# Patient Record
Sex: Female | Born: 1990 | Race: Black or African American
Health system: Southern US, Community
[De-identification: ages and names within clinical notes are randomized; demographics above are authoritative.]

## PROBLEM LIST (undated history)

## (undated) DIAGNOSIS — J45909 Unspecified asthma, uncomplicated: Secondary | ICD-10-CM

## (undated) DIAGNOSIS — F419 Anxiety disorder, unspecified: Secondary | ICD-10-CM

## (undated) DIAGNOSIS — Z3490 Encounter for supervision of normal pregnancy, unspecified, unspecified trimester: Secondary | ICD-10-CM

## (undated) HISTORY — PX: NO PAST SURGERIES: SHX2092

## (undated) HISTORY — PX: FOOT FRACTURE SURGERY: SHX645

## (undated) HISTORY — DX: Encounter for supervision of normal pregnancy, unspecified, unspecified trimester: Z34.90

---
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
## 2012-02-15 NOTE — L&D Delivery Note (Signed)
Pt was admitted last pm for cytotec induction secondary to postterm pregnancy. She had pitocin augmentation and AROM. She completed the first stage with out difficulty. The second stage was uncomplicated. She had a SVD of one live black female infant over 2 degree midline tear. Placenta- S/I. Nuchal cord x 1. Tear closed with 3-0 chromic. Baby to newborn nursery. EBL-400cc

## 2012-04-25 LAB — OB RESULTS CONSOLE GBS: GBS: NEGATIVE

## 2012-05-30 ENCOUNTER — Inpatient Hospital Stay (HOSPITAL_COMMUNITY): Admission: AD | Admit: 2012-05-30 | Payer: Self-pay | Source: Ambulatory Visit | Admitting: Obstetrics and Gynecology

## 2012-06-01 ENCOUNTER — Encounter (HOSPITAL_COMMUNITY): Payer: Self-pay | Admitting: *Deleted

## 2012-06-01 ENCOUNTER — Telehealth (HOSPITAL_COMMUNITY): Payer: Self-pay | Admitting: *Deleted

## 2012-06-01 NOTE — Telephone Encounter (Signed)
Preadmission screen  

## 2012-06-05 ENCOUNTER — Encounter (HOSPITAL_COMMUNITY): Payer: Self-pay

## 2012-06-05 ENCOUNTER — Inpatient Hospital Stay (HOSPITAL_COMMUNITY)
Admission: RE | Admit: 2012-06-05 | Discharge: 2012-06-08 | DRG: 775 | Disposition: A | Discharge status: Home / Self Care | Payer: Medicaid Other | Source: Ambulatory Visit | Attending: Obstetrics & Gynecology | Admitting: Obstetrics & Gynecology

## 2012-06-05 VITALS — BP 112/75 | HR 89 | Temp 97.9°F | Resp 20 | Ht 65.0 in | Wt 183.0 lb

## 2012-06-05 DIAGNOSIS — O480 Post-term pregnancy: Principal | ICD-10-CM | POA: Diagnosis present

## 2012-06-05 DIAGNOSIS — Z3480 Encounter for supervision of other normal pregnancy, unspecified trimester: Secondary | ICD-10-CM

## 2012-06-05 LAB — CBC
Hemoglobin: 10.7 g/dL — ABNORMAL LOW (ref 12.0–15.0)
MCH: 26.2 pg (ref 26.0–34.0)
MCHC: 32.7 g/dL (ref 30.0–36.0)
Platelets: 146 10*3/uL — ABNORMAL LOW (ref 150–400)
RBC: 4.08 MIL/uL (ref 3.87–5.11)

## 2012-06-05 MED ORDER — ONDANSETRON HCL 4 MG/2ML IJ SOLN: 4.0000 mg | Freq: Four times a day (QID) | INTRAMUSCULAR | Status: DC | PRN

## 2012-06-05 MED ORDER — FLEET ENEMA 7-19 GM/118ML RE ENEM: 1.0000 | ENEMA | RECTAL | Status: DC | PRN

## 2012-06-05 MED ORDER — OXYTOCIN 40 UNITS IN LACTATED RINGERS INFUSION - SIMPLE MED
1.0000 m[IU]/min | INTRAVENOUS | Status: DC
  Administered 2012-06-06: 2 m[IU]/min via INTRAVENOUS

## 2012-06-05 MED ORDER — LACTATED RINGERS IV SOLN
INTRAVENOUS | Status: DC
  Administered 2012-06-06 (×5): via INTRAVENOUS

## 2012-06-05 MED ORDER — CITRIC ACID-SODIUM CITRATE 334-500 MG/5ML PO SOLN: 30.0000 mL | ORAL | Status: DC | PRN

## 2012-06-05 MED ORDER — LACTATED RINGERS IV SOLN: 500.0000 mL | INTRAVENOUS | Status: DC | PRN

## 2012-06-05 MED ORDER — OXYTOCIN BOLUS FROM INFUSION: 500.0000 mL | INTRAVENOUS | Status: DC

## 2012-06-05 MED ORDER — TERBUTALINE SULFATE 1 MG/ML IJ SOLN: 0.2500 mg | Freq: Once | INTRAMUSCULAR | Status: AC | PRN

## 2012-06-05 MED ORDER — OXYCODONE-ACETAMINOPHEN 5-325 MG PO TABS: 1.0000 | ORAL_TABLET | ORAL | Status: DC | PRN

## 2012-06-05 MED ORDER — OXYTOCIN 40 UNITS IN LACTATED RINGERS INFUSION - SIMPLE MED
62.5000 mL/h | INTRAVENOUS | Status: DC
  Administered 2012-06-06: 62.5 mL/h via INTRAVENOUS
  Filled 2012-06-05: qty 1000

## 2012-06-05 MED ORDER — MISOPROSTOL 25 MCG QUARTER TABLET
25.0000 ug | ORAL_TABLET | ORAL | Status: DC | PRN
  Administered 2012-06-05 – 2012-06-06 (×2): 25 ug via VAGINAL
  Filled 2012-06-05: qty 0.25
  Filled 2012-06-05: qty 1
  Filled 2012-06-05 (×2): qty 0.25

## 2012-06-05 MED ORDER — BUTORPHANOL TARTRATE 1 MG/ML IJ SOLN
1.0000 mg | INTRAMUSCULAR | Status: DC | PRN
  Administered 2012-06-06 (×2): 1 mg via INTRAVENOUS
  Filled 2012-06-05 (×2): qty 1

## 2012-06-05 MED ORDER — IBUPROFEN 600 MG PO TABS: 600.0000 mg | ORAL_TABLET | Freq: Four times a day (QID) | ORAL | Status: DC | PRN

## 2012-06-05 MED ORDER — LIDOCAINE HCL (PF) 1 % IJ SOLN
30.0000 mL | INTRAMUSCULAR | Status: DC | PRN
  Administered 2012-06-06: 30 mL via SUBCUTANEOUS
  Filled 2012-06-05 (×2): qty 30

## 2012-06-05 MED ORDER — ACETAMINOPHEN 325 MG PO TABS: 650.0000 mg | ORAL_TABLET | ORAL | Status: DC | PRN

## 2012-06-05 NOTE — H&P (Signed)
22 y.o. G1P0  Estimated Date of Delivery: 05/30/12 admitted at [redacted] weeks gestation for induction.  Prenatal Transfer Tool  Maternal Diabetes: No Genetic Screening: Normal Maternal Ultrasounds/Referrals: Normal Fetal Ultrasounds or other Referrals:  None Maternal Substance Abuse:  No Significant Maternal Medications:  None Significant Maternal Lab Results: None Other Significant Pregnancy Complications:  Post dates  Afebrile, VSS Heart and Lungs: No active disease Abdomen: soft, gravid, EFW AGA. Cervical exam:  1/0, -3 (per nurse exam)  Impression: Post date pregnancy  Plan:  Cytotec/pitocin induction

## 2012-06-06 ENCOUNTER — Inpatient Hospital Stay (HOSPITAL_COMMUNITY): Payer: Medicaid Other | Admitting: Anesthesiology

## 2012-06-06 ENCOUNTER — Encounter (HOSPITAL_COMMUNITY): Payer: Self-pay | Admitting: Anesthesiology

## 2012-06-06 ENCOUNTER — Encounter (HOSPITAL_COMMUNITY): Payer: Self-pay

## 2012-06-06 LAB — RPR: RPR Ser Ql: NONREACTIVE

## 2012-06-06 MED ORDER — OXYCODONE-ACETAMINOPHEN 5-325 MG PO TABS
1.0000 | ORAL_TABLET | ORAL | Status: DC | PRN
  Administered 2012-06-06 – 2012-06-07 (×3): 1 via ORAL
  Administered 2012-06-07 – 2012-06-08 (×2): 2 via ORAL
  Filled 2012-06-06 (×3): qty 1
  Filled 2012-06-06: qty 2
  Filled 2012-06-06 (×2): qty 1

## 2012-06-06 MED ORDER — TETANUS-DIPHTH-ACELL PERTUSSIS 5-2.5-18.5 LF-MCG/0.5 IM SUSP
0.5000 mL | Freq: Once | INTRAMUSCULAR | Status: AC
  Administered 2012-06-07: 0.5 mL via INTRAMUSCULAR

## 2012-06-06 MED ORDER — EPHEDRINE 5 MG/ML INJ
10.0000 mg | INTRAVENOUS | Status: DC | PRN
  Filled 2012-06-06: qty 4
  Filled 2012-06-06: qty 2

## 2012-06-06 MED ORDER — EPHEDRINE 5 MG/ML INJ
10.0000 mg | INTRAVENOUS | Status: DC | PRN
  Filled 2012-06-06: qty 2

## 2012-06-06 MED ORDER — DIPHENHYDRAMINE HCL 50 MG/ML IJ SOLN: 12.5000 mg | INTRAMUSCULAR | Status: DC | PRN

## 2012-06-06 MED ORDER — PHENYLEPHRINE 40 MCG/ML (10ML) SYRINGE FOR IV PUSH (FOR BLOOD PRESSURE SUPPORT)
80.0000 ug | PREFILLED_SYRINGE | INTRAVENOUS | Status: DC | PRN
  Filled 2012-06-06: qty 2
  Filled 2012-06-06: qty 5

## 2012-06-06 MED ORDER — SIMETHICONE 80 MG PO CHEW: 80.0000 mg | CHEWABLE_TABLET | ORAL | Status: DC | PRN

## 2012-06-06 MED ORDER — MEASLES, MUMPS & RUBELLA VAC ~~LOC~~ INJ
0.5000 mL | INJECTION | Freq: Once | SUBCUTANEOUS | Status: DC
  Filled 2012-06-06: qty 0.5

## 2012-06-06 MED ORDER — LACTATED RINGERS IV SOLN: 500.0000 mL | Freq: Once | INTRAVENOUS | Status: DC

## 2012-06-06 MED ORDER — ONDANSETRON HCL 4 MG PO TABS: 4.0000 mg | ORAL_TABLET | ORAL | Status: DC | PRN

## 2012-06-06 MED ORDER — SODIUM BICARBONATE 8.4 % IV SOLN
INTRAVENOUS | Status: DC | PRN
  Administered 2012-06-06: 5 mL via EPIDURAL

## 2012-06-06 MED ORDER — IBUPROFEN 600 MG PO TABS
600.0000 mg | ORAL_TABLET | Freq: Four times a day (QID) | ORAL | Status: DC
  Administered 2012-06-06 – 2012-06-08 (×7): 600 mg via ORAL
  Filled 2012-06-06 (×7): qty 1

## 2012-06-06 MED ORDER — ONDANSETRON HCL 4 MG/2ML IJ SOLN: 4.0000 mg | INTRAMUSCULAR | Status: DC | PRN

## 2012-06-06 MED ORDER — PHENYLEPHRINE 40 MCG/ML (10ML) SYRINGE FOR IV PUSH (FOR BLOOD PRESSURE SUPPORT)
80.0000 ug | PREFILLED_SYRINGE | INTRAVENOUS | Status: DC | PRN
  Filled 2012-06-06: qty 2

## 2012-06-06 MED ORDER — FENTANYL 2.5 MCG/ML BUPIVACAINE 1/10 % EPIDURAL INFUSION (WH - ANES)
14.0000 mL/h | INTRAMUSCULAR | Status: DC | PRN
  Administered 2012-06-06: 14 mL/h via EPIDURAL
  Filled 2012-06-06: qty 125

## 2012-06-06 MED ORDER — ZOLPIDEM TARTRATE 5 MG PO TABS: 5.0000 mg | ORAL_TABLET | Freq: Every evening | ORAL | Status: DC | PRN

## 2012-06-06 MED ORDER — BENZOCAINE-MENTHOL 20-0.5 % EX AERO
1.0000 "application " | INHALATION_SPRAY | CUTANEOUS | Status: DC | PRN
  Administered 2012-06-06 – 2012-06-08 (×2): 1 via TOPICAL
  Filled 2012-06-06 (×2): qty 56

## 2012-06-06 MED ORDER — DIBUCAINE 1 % RE OINT: 1.0000 "application " | TOPICAL_OINTMENT | RECTAL | Status: DC | PRN

## 2012-06-06 MED ORDER — WITCH HAZEL-GLYCERIN EX PADS: 1.0000 "application " | MEDICATED_PAD | CUTANEOUS | Status: DC | PRN

## 2012-06-06 NOTE — Anesthesia Procedure Notes (Signed)

## 2012-06-06 NOTE — Anesthesia Preprocedure Evaluation (Signed)

## 2012-06-07 LAB — CBC
HCT: 28.7 % — ABNORMAL LOW (ref 36.0–46.0)
MCH: 26.9 pg (ref 26.0–34.0)
MCHC: 33.4 g/dL (ref 30.0–36.0)
MCV: 80.4 fL (ref 78.0–100.0)
Platelets: 133 10*3/uL — ABNORMAL LOW (ref 150–400)
RDW: 14.8 % (ref 11.5–15.5)
WBC: 11.3 10*3/uL — ABNORMAL HIGH (ref 4.0–10.5)

## 2012-06-07 NOTE — Progress Notes (Signed)
UR chart review completed.  

## 2012-06-07 NOTE — Progress Notes (Signed)
Pt had questions about Medicaid coverage for the infant. Since pt has Medicaid, CSW told pt that her infant would automatically be added. CSW informed pt that our financial counselors would send required information to DSS but suggested that pt contact her Medicaid worker to inform of delivery, as well. Pt understanding & thanked CSW for information.      

## 2012-06-07 NOTE — Progress Notes (Signed)
Patient is eating, ambulating, voiding.  Pain control is good.  Appropriate lochia.  No complaints.  NO HA/vision change or RUQ pain.  Filed Vitals:   06/06/12 1900 06/06/12 2010 06/07/12 0121 06/07/12 0541  BP: 126/80 109/70 104/68 128/82  Pulse: 94 108 102 85  Temp: 98.5 F (36.9 C) 98.4 F (36.9 C) 98.2 F (36.8 C) 98.7 F (37.1 C)  TempSrc: Oral Oral Oral Oral  Resp: 20 18 18 18   Height:      Weight:      SpO2: 98%       Fundus firm Perineum without swelling. No CT  Lab Results  Component Value Date   WBC 11.3* 06/07/2012   HGB 9.6* 06/07/2012   HCT 28.7* 06/07/2012   MCV 80.4 06/07/2012   PLT 133* 06/07/2012    B/Positive/-- (10/11 0000)  A/P Post partum day 1. Doing well  Routine care.  Expect d/c 4/25.    Kayla Bauer

## 2012-06-07 NOTE — Anesthesia Postprocedure Evaluation (Signed)
  Anesthesia Post-op Note  Patient: Kayla Bauer  Procedure(s) Performed: * No procedures listed *  Patient Location: Mother/Baby  Anesthesia Type:Epidural  Level of Consciousness: awake  Airway and Oxygen Therapy: Patient Spontanous Breathing  Post-op Pain: none  Post-op Assessment: Patient's Cardiovascular Status Stable, Respiratory Function Stable, Patent Airway, No signs of Nausea or vomiting, Adequate PO intake, Pain level controlled, No headache, No backache, No residual numbness and No residual motor weakness  Post-op Vital Signs: Reviewed and stable  Complications: No apparent anesthesia complications

## 2012-06-08 MED ORDER — IBUPROFEN 600 MG PO TABS: 600.0000 mg | ORAL_TABLET | Freq: Four times a day (QID) | ORAL | Status: DC

## 2012-06-08 MED ORDER — OXYCODONE-ACETAMINOPHEN 5-325 MG PO TABS: 1.0000 | ORAL_TABLET | ORAL | Status: DC | PRN

## 2012-06-08 NOTE — Progress Notes (Signed)
PPD#2 Pt without c/o. Ready for discharge 

## 2012-06-08 NOTE — Discharge Summary (Signed)
Obstetric Discharge Summary Reason for Admission: induction of labor Prenatal Procedures: ultrasound Intrapartum Procedures: spontaneous vaginal delivery Postpartum Procedures: none Complications-Operative and Postpartum: 2 degree perineal laceration Hemoglobin  Date Value Range Status  06/07/2012 9.6* 12.0 - 15.0 g/dL Final     HCT  Date Value Range Status  06/07/2012 28.7* 36.0 - 46.0 % Final    Physical Exam:  General: alert Lochia: appropriate Uterine Fundus: firm  Discharge Diagnoses: Term Pregnancy-delivered  Discharge Information: Date: 06/08/2012 Activity: pelvic rest Diet: routine Medications: PNV, Ibuprofen and Percocet Condition: stable Instructions: refer to practice specific booklet Discharge to: home Follow-up Information   Follow up with Levi Aland, MD. Schedule an appointment as soon as possible for a visit in 4 weeks.   Contact information:   [address] [place] [address] [phone]       Newborn Data: Live born female  Birth Weight: 7 lb 3 oz (3260 g) APGAR: 8, 9  Home with mother.  ANDERSON,MARK E 06/08/2012, 8:48 AM

## 2013-12-16 ENCOUNTER — Encounter (HOSPITAL_COMMUNITY): Payer: Self-pay

## 2015-05-17 ENCOUNTER — Encounter (HOSPITAL_COMMUNITY): Payer: Self-pay | Admitting: *Deleted

## 2015-05-17 ENCOUNTER — Emergency Department (HOSPITAL_COMMUNITY)
Admission: EM | Admit: 2015-05-17 | Discharge: 2015-05-17 | Disposition: A | Discharge status: Home / Self Care | Payer: Medicaid Other | Attending: Emergency Medicine | Admitting: Emergency Medicine

## 2015-05-17 DIAGNOSIS — R112 Nausea with vomiting, unspecified: Secondary | ICD-10-CM | POA: Insufficient documentation

## 2015-05-17 DIAGNOSIS — R05 Cough: Secondary | ICD-10-CM | POA: Insufficient documentation

## 2015-05-17 DIAGNOSIS — J029 Acute pharyngitis, unspecified: Secondary | ICD-10-CM | POA: Insufficient documentation

## 2015-05-17 DIAGNOSIS — F419 Anxiety disorder, unspecified: Secondary | ICD-10-CM | POA: Diagnosis not present

## 2015-05-17 DIAGNOSIS — F17210 Nicotine dependence, cigarettes, uncomplicated: Secondary | ICD-10-CM | POA: Insufficient documentation

## 2015-05-17 DIAGNOSIS — R079 Chest pain, unspecified: Secondary | ICD-10-CM | POA: Insufficient documentation

## 2015-05-17 DIAGNOSIS — R0989 Other specified symptoms and signs involving the circulatory and respiratory systems: Secondary | ICD-10-CM | POA: Diagnosis not present

## 2015-05-17 DIAGNOSIS — H9202 Otalgia, left ear: Secondary | ICD-10-CM | POA: Insufficient documentation

## 2015-05-17 HISTORY — DX: Anxiety disorder, unspecified: F41.9

## 2015-05-17 LAB — CBC
HEMATOCRIT: 41.5 % (ref 36.0–46.0)
HEMOGLOBIN: 14 g/dL (ref 12.0–15.0)
MCH: 27.6 pg (ref 26.0–34.0)
MCHC: 33.7 g/dL (ref 30.0–36.0)
MCV: 81.7 fL (ref 78.0–100.0)
Platelets: 205 10*3/uL (ref 150–400)
RBC: 5.08 MIL/uL (ref 3.87–5.11)
RDW: 13.1 % (ref 11.5–15.5)
WBC: 6.7 10*3/uL (ref 4.0–10.5)

## 2015-05-17 LAB — COMPREHENSIVE METABOLIC PANEL
ALT: 16 U/L (ref 14–54)
ANION GAP: 13 (ref 5–15)
AST: 27 U/L (ref 15–41)
Albumin: 3.7 g/dL (ref 3.5–5.0)
Alkaline Phosphatase: 54 U/L (ref 38–126)
BUN: 6 mg/dL (ref 6–20)
CHLORIDE: 100 mmol/L — AB (ref 101–111)
CO2: 26 mmol/L (ref 22–32)
Calcium: 9.1 mg/dL (ref 8.9–10.3)
Creatinine, Ser: 0.86 mg/dL (ref 0.44–1.00)
GFR calc Af Amer: >60 mL/min (ref >60)
Glucose, Bld: 105 mg/dL — ABNORMAL HIGH (ref 65–99)
POTASSIUM: 3.1 mmol/L — AB (ref 3.5–5.1)
Sodium: 139 mmol/L (ref 135–145)
Total Bilirubin: 0.8 mg/dL (ref 0.3–1.2)
Total Protein: 8.1 g/dL (ref 6.5–8.1)

## 2015-05-17 NOTE — ED Notes (Addendum)
Pt in c/o runny nose, chest congestion, productive cough onset x 4 days, pt reports L ear pain & sore throat, pt reports chest pain with deep inspirations & cough, pt reports anxiety attach x 2 days ago, pt reports n/v, denies diarrhea, pt reports x 2 vomiting episodes in the last 24 hrs, pt reports x 2 loose stools in 24 hrs, pt A&O x4

## 2015-05-17 NOTE — ED Notes (Signed)
Pt stated she didn't to wait and left.

## 2015-08-26 ENCOUNTER — Encounter (HOSPITAL_COMMUNITY): Payer: Self-pay | Admitting: Emergency Medicine

## 2015-08-26 ENCOUNTER — Emergency Department (HOSPITAL_COMMUNITY)
Admission: EM | Admit: 2015-08-26 | Discharge: 2015-08-26 | Disposition: A | Discharge status: Home / Self Care | Payer: Medicaid Other | Attending: Emergency Medicine | Admitting: Emergency Medicine

## 2015-08-26 ENCOUNTER — Emergency Department (HOSPITAL_COMMUNITY): Payer: Medicaid Other

## 2015-08-26 DIAGNOSIS — Y999 Unspecified external cause status: Secondary | ICD-10-CM | POA: Insufficient documentation

## 2015-08-26 DIAGNOSIS — M7918 Myalgia, other site: Secondary | ICD-10-CM

## 2015-08-26 DIAGNOSIS — S93402A Sprain of unspecified ligament of left ankle, initial encounter: Secondary | ICD-10-CM

## 2015-08-26 DIAGNOSIS — Y929 Unspecified place or not applicable: Secondary | ICD-10-CM | POA: Diagnosis not present

## 2015-08-26 DIAGNOSIS — S99912A Unspecified injury of left ankle, initial encounter: Secondary | ICD-10-CM | POA: Diagnosis present

## 2015-08-26 DIAGNOSIS — Y939 Activity, unspecified: Secondary | ICD-10-CM | POA: Insufficient documentation

## 2015-08-26 DIAGNOSIS — W1789XA Other fall from one level to another, initial encounter: Secondary | ICD-10-CM | POA: Diagnosis not present

## 2015-08-26 DIAGNOSIS — F17210 Nicotine dependence, cigarettes, uncomplicated: Secondary | ICD-10-CM | POA: Diagnosis not present

## 2015-08-26 HISTORY — DX: Unspecified asthma, uncomplicated: J45.909

## 2015-08-26 MED ORDER — OXYCODONE-ACETAMINOPHEN 5-325 MG PO TABS
1.0000 | ORAL_TABLET | Freq: Once | ORAL | Status: AC
  Administered 2015-08-26: 1 via ORAL
  Filled 2015-08-26: qty 1

## 2015-08-26 MED ORDER — NAPROXEN 500 MG PO TABS: 500.0000 mg | ORAL_TABLET | Freq: Two times a day (BID) | ORAL | Status: DC

## 2015-08-26 NOTE — ED Notes (Signed)
Patient able to ambulate independently with crutches 

## 2015-08-26 NOTE — ED Notes (Signed)
Pt presents to ED for assessment of left foot pain due to falling from a tire swing.  Swelling noted, pulses intact.

## 2015-08-26 NOTE — ED Provider Notes (Signed)
CSN: [id number]     Arrival date & time 08/26/15  1603 History  By signing my name below, I, Renetta ChalkBobby Ross, attest that this documentation has been prepared under the direction and in the presence of Audry Piliyler Miguel Christiana, PA-C.  Electronically Signed: Renetta ChalkBobby Ross, ED Scribe. 08/26/2015. 4:56 PM   Chief Complaint  Patient presents with  . Ankle Pain   The history is provided by the patient. No language interpreter was used.   HPI Comments: Kayla Bauer is a 25 y.o. female who presents to the Emergency Department complaining of left ankle pain s/p a fall that occurred 30 minutes ago. Pt states she was swinging on a tire swing when she accidentally slipped off and fell from a height of approximately 10 feet. Pt states she landed directly on her left foot and heard a "pop" when she landed on it. Pt reports the majority of the pain is to the lateral portion of her left ankle ankle and foot. Pain is 10/10. Pt denies any other injury. Pt states she has not taken anything for pain PTA.   Past Medical History  Diagnosis Date  . Anxiety    Past Surgical History  Procedure Laterality Date  . No past surgeries     Family History  Problem Relation Age of Onset  . Hypertension Mother   . Diabetes Paternal Aunt   . Diabetes Paternal Grandfather    Social History  Substance Use Topics  . Smoking status: Current Every Day Smoker -- 0.40 packs/day    Types: Cigarettes  . Smokeless tobacco: Never Used  . Alcohol Use: No   OB History    Gravida Para Term Preterm AB TAB SAB Ectopic Multiple Living   1 1 1       1      Review of Systems  Musculoskeletal: Positive for arthralgias (left ankle and left foot).  Neurological: Negative for numbness.  All other systems reviewed and are negative.  Allergies  Review of patient's allergies indicates no known allergies.  Home Medications   Prior to Admission medications   Medication Sig Start Date End Date Taking? Authorizing Provider  acetaminophen (TYLENOL) 500  MG tablet Take 500 mg by mouth every 6 (six) hours as needed for pain (back pain).    Historical Provider, MD  ibuprofen (ADVIL,MOTRIN) 600 MG tablet Take 1 tablet (600 mg total) by mouth every 6 (six) hours. 06/08/12   Levi AlandMark E Anderson, MD  oxyCODONE-acetaminophen (PERCOCET/ROXICET) 5-325 MG per tablet Take 1-2 tablets by mouth every 4 (four) hours as needed. 06/08/12   Levi AlandMark E Anderson, MD  Prenatal Vit-Fe Fumarate-FA (PRENATAL MULTIVITAMIN) TABS Take 1 tablet by mouth daily at 12 noon.    Historical Provider, MD   BP 102/79 mmHg  Pulse 88  Temp(Src) 98.8 F (37.1 C) (Oral)  Resp 20  SpO2 100%   Physical Exam  Constitutional: She is oriented to person, place, and time. She appears well-developed and well-nourished. No distress.  HENT:  Head: Normocephalic and atraumatic.  Eyes: EOM are normal.  Neck: Normal range of motion.  Cardiovascular: Normal rate and regular rhythm.   Pulmonary/Chest: Effort normal.  Abdominal: Soft.  Musculoskeletal: Normal range of motion. She exhibits edema and tenderness.  TTP on left tarsal bones specifically 1-3 non ttp on medial and lateral malleolus. Mild swelling noted on the anterior aspect of the left foot. Neurovascularly intact, distal pulses appreciated. Cap refill <2 sec. No erythema or signs of infection.   Neurological: She is alert and oriented  to person, place, and time.  Skin: Skin is warm and dry. She is not diaphoretic.  Psychiatric: She has a normal mood and affect. Her behavior is normal. Judgment and thought content normal.  Nursing note and vitals reviewed.  ED Course  Procedures  DIAGNOSTIC STUDIES: Oxygen Saturation is 100% on RA, normal by my interpretation.  COORDINATION OF CARE: 4:50 PM-Will order imaging. Discussed treatment plan with pt at bedside and pt agreed to plan.   Labs Review Labs Reviewed - No data to display  Imaging Review Dg Ankle Complete Left  08/26/2015  CLINICAL DATA:  Left foot and ankle pain after jumping  off a tire swing and turning the foot inward and hearing a pop. EXAM: LEFT ANKLE COMPLETE - 3+ VIEW COMPARISON:  Left foot radiographs obtained at the same time. FINDINGS: Mild diffuse soft tissue swelling. No fracture, dislocation or effusion seen. IMPRESSION: No fracture. Electronically Signed   By: Beckie Salts M.D.   On: 08/26/2015 17:30   Dg Foot Complete Left  08/26/2015  CLINICAL DATA:  Left foot and ankle pain after jumping off a tire swing and turning the foot inward and hearing a pop. EXAM: LEFT FOOT - COMPLETE 3+ VIEW COMPARISON:  Left ankle radiographs obtained at the same time. FINDINGS: Mild diffuse soft tissue swelling, most pronounced dorsally. No fracture or dislocation seen. IMPRESSION: No fracture. Electronically Signed   By: Beckie Salts M.D.   On: 08/26/2015 17:31   I have personally reviewed and evaluated these images and lab results as part of my medical decision-making.   EKG Interpretation None      MDM  I have reviewed and evaluated the relevant imaging studies.  I have reviewed the relevant previous healthcare records.I obtained HPI from historian.  ED Course:  Assessment: Pt is a 25yF who presents with s/p fall from free (unknown height) and landed on feet. Twisted left ankle underneath her. On exam, pt in NAD. Nontoxic/nonseptic appearing. VSS. Afebrile. Swelling noted along MTPs, which are TTP. No erythema. Non tender to medial and lateral malleolus. Imaging without fracture or dislocation. Given analgesia in ED. Plan is to DC home with ASO brace and Crutches with follow up to PCP At time of discharge, Patient is in no acute distress. Vital Signs are stable. Patient is able to ambulate. Patient able to tolerate PO.    Disposition/Plan:  DC Home Additional Verbal discharge instructions given and discussed with patient.  Pt Instructed to f/u with Ortho in the next week for evaluation and treatment of symptoms. Return precautions given Pt acknowledges and agrees  with plan  Supervising Physician Lyndal Pulley, MD   Final diagnoses:  Ankle sprain, left, initial encounter  Musculoskeletal pain    I personally performed the services described in this documentation, which was scribed in my presence. The recorded information has been reviewed and is accurate.   Audry Pili, PA-C 08/26/15 1745  Lyndal Pulley, MD 08/27/15 [phone]

## 2015-08-26 NOTE — Progress Notes (Signed)
Orthopedic Tech Progress Note Patient Details:  Marisa SeverinLexus Korte 08/06/1990 [id number]  Ortho Devices Type of Ortho Device: Crutches, ASO Ortho Device/Splint Location: LLE ankle Ortho Device/Splint Interventions: Ordered, Application   Jennye MoccasinHughes, Brendi Mccarroll Craig 08/26/2015, 5:48 PM

## 2015-08-26 NOTE — Discharge Instructions (Signed)
Please read and follow all provided instructions.  Your diagnoses today include:  1. Ankle sprain, left, initial encounter   2. Musculoskeletal pain    Tests performed today include:  Vital signs. See below for your results today.   Medications prescribed:   Take as prescribed   Home care instructions:  Follow any educational materials contained in this packet.  Follow-up instructions: Please follow-up with your primary care provider for further evaluation of symptoms and treatment   Return instructions:   Please return to the Emergency Department if you do not get better, if you get worse, or new symptoms OR  - Fever (temperature greater than 101.41F)  - Bleeding that does not stop with holding pressure to the area    -Severe pain (please note that you may be more sore the day after your accident)  - Chest Pain  - Difficulty breathing  - Severe nausea or vomiting  - Inability to tolerate food and liquids  - Passing out  - Skin becoming red around your wounds  - Change in mental status (confusion or lethargy)  - New numbness or weakness     Please return if you have any other emergent concerns.  Additional Information:  Your vital signs today were: BP 102/79 mmHg   Pulse 88   Temp(Src) 98.8 F (37.1 C) (Oral)   Resp 20   SpO2 100%   LMP 08/26/2015 If your blood pressure (BP) was elevated above 135/85 this visit, please have this repeated by your doctor within one month. ---------------

## 2015-08-26 NOTE — ED Notes (Signed)
Ortho at bedside.

## 2018-12-03 ENCOUNTER — Emergency Department (HOSPITAL_BASED_OUTPATIENT_CLINIC_OR_DEPARTMENT_OTHER)
Admission: EM | Admit: 2018-12-03 | Discharge: 2018-12-03 | Disposition: A | Discharge status: Home / Self Care | Payer: Medicaid Other | Attending: Emergency Medicine | Admitting: Emergency Medicine

## 2018-12-03 ENCOUNTER — Encounter (HOSPITAL_BASED_OUTPATIENT_CLINIC_OR_DEPARTMENT_OTHER): Payer: Self-pay | Admitting: Emergency Medicine

## 2018-12-03 ENCOUNTER — Other Ambulatory Visit: Payer: Self-pay

## 2018-12-03 DIAGNOSIS — Z87891 Personal history of nicotine dependence: Secondary | ICD-10-CM | POA: Diagnosis not present

## 2018-12-03 DIAGNOSIS — N939 Abnormal uterine and vaginal bleeding, unspecified: Secondary | ICD-10-CM | POA: Insufficient documentation

## 2018-12-03 DIAGNOSIS — R102 Pelvic and perineal pain: Secondary | ICD-10-CM | POA: Diagnosis not present

## 2018-12-03 DIAGNOSIS — N760 Acute vaginitis: Secondary | ICD-10-CM | POA: Insufficient documentation

## 2018-12-03 DIAGNOSIS — Z79899 Other long term (current) drug therapy: Secondary | ICD-10-CM | POA: Diagnosis not present

## 2018-12-03 DIAGNOSIS — B9689 Other specified bacterial agents as the cause of diseases classified elsewhere: Secondary | ICD-10-CM

## 2018-12-03 LAB — COMPREHENSIVE METABOLIC PANEL
ALT: 11 U/L (ref 0–44)
AST: 16 U/L (ref 15–41)
Albumin: 4.3 g/dL (ref 3.5–5.0)
Alkaline Phosphatase: 46 U/L (ref 38–126)
Anion gap: 8 (ref 5–15)
BUN: 12 mg/dL (ref 6–20)
CO2: 26 mmol/L (ref 22–32)
Calcium: 9 mg/dL (ref 8.9–10.3)
Chloride: 103 mmol/L (ref 98–111)
Creatinine, Ser: 0.61 mg/dL (ref 0.44–1.00)
GFR calc Af Amer: >60 mL/min (ref >60)
GFR calc non Af Amer: >60 mL/min (ref >60)
Glucose, Bld: 85 mg/dL (ref 70–99)
Potassium: 4 mmol/L (ref 3.5–5.1)
Sodium: 137 mmol/L (ref 135–145)
Total Bilirubin: 0.6 mg/dL (ref 0.3–1.2)
Total Protein: 7.7 g/dL (ref 6.5–8.1)

## 2018-12-03 LAB — CBC WITH DIFFERENTIAL/PLATELET
Abs Immature Granulocytes: 0.01 10*3/uL (ref 0.00–0.07)
Basophils Absolute: 0 10*3/uL (ref 0.0–0.1)
Basophils Relative: 1 %
Eosinophils Absolute: 0.1 10*3/uL (ref 0.0–0.5)
Eosinophils Relative: 1 %
HCT: 40.2 % (ref 36.0–46.0)
Hemoglobin: 12.8 g/dL (ref 12.0–15.0)
Immature Granulocytes: 0 %
Lymphocytes Relative: 42 %
Lymphs Abs: 2.6 10*3/uL (ref 0.7–4.0)
MCH: 27.1 pg (ref 26.0–34.0)
MCHC: 31.8 g/dL (ref 30.0–36.0)
MCV: 85 fL (ref 80.0–100.0)
Monocytes Absolute: 0.4 10*3/uL (ref 0.1–1.0)
Monocytes Relative: 6 %
Neutro Abs: 3.2 10*3/uL (ref 1.7–7.7)
Neutrophils Relative %: 50 %
Platelets: 230 10*3/uL (ref 150–400)
RBC: 4.73 MIL/uL (ref 3.87–5.11)
RDW: 13.2 % (ref 11.5–15.5)
WBC: 6.3 10*3/uL (ref 4.0–10.5)
nRBC: 0 % (ref 0.0–0.2)

## 2018-12-03 LAB — WET PREP, GENITAL
Sperm: NONE SEEN
Trich, Wet Prep: NONE SEEN
Yeast Wet Prep HPF POC: NONE SEEN

## 2018-12-03 LAB — HCG, QUANTITATIVE, PREGNANCY: hCG, Beta Chain, Quant, S: <1 m[IU]/mL (ref <5)

## 2018-12-03 MED ORDER — METRONIDAZOLE 500 MG PO TABS: 500.0000 mg | ORAL_TABLET | Freq: Two times a day (BID) | ORAL | 0 refills | Status: DC

## 2018-12-03 NOTE — ED Notes (Addendum)
Discharge vitals not obtained- see prior note.

## 2018-12-03 NOTE — ED Notes (Signed)
Pt ambulatory to check out- EDP discussed discharge with pt but pt declined waiting for papers. RN informed pt of discharge education and Mychart results.

## 2018-12-03 NOTE — Discharge Instructions (Addendum)
Please take Ibuprofen (Advil, motrin) and Tylenol (acetaminophen) to relieve your pain.  You may take up to 600 MG (3 pills) of normal strength ibuprofen every 8 hours as needed.  In between doses of ibuprofen you make take tylenol, up to 1,000 mg (two extra strength pills).  Do not take more than 3,000 mg tylenol in a 24 hour period.  Please check all medication labels as many medications such as pain and cold medications may contain tylenol.  Do not drink alcohol while taking these medications.  Do not take other NSAID'S while taking ibuprofen (such as aleve or naproxen).  Please take ibuprofen with food to decrease stomach upset.  Today your diagnosed with bacterial vaginosis and received a prescription for metronidazole also known as Flagyl. It is very important that you do not consume any alcohol while taking this medication as it will cause you to become violently ill.   

## 2018-12-03 NOTE — ED Notes (Signed)
Pt ambulatory to BR

## 2018-12-03 NOTE — ED Provider Notes (Signed)
[facility] EMERGENCY DEPARTMENT Provider Note   CSN: [id number] Arrival date & time: 12/03/18  1913     History   Chief Complaint Chief Complaint  Patient presents with  . Vaginal Bleeding    HPI Kayla Bauer is a 28 y.o. female with a past medical history of anxiety who presents today for evaluation of vaginal bleeding.  She reports that she had a period on the fourth of this month which lasted 3 days and was normal for her.  She reports that however since then she has been having abdominal pain.  She states that she feels like her breasts are swollen and sore.  She reports that she started having bleeding again this morning despite having her pelvic pain continue since the end of her last menstrual cycle.  She denies any dysuria, increased frequency urgency.  No recent trauma or concern for lacerations.  She is sexually active with a female partner.     HPI  Past Medical History:  Diagnosis Date  . Anxiety     There are no active problems to display for this patient.   Past Surgical History:  Procedure Laterality Date  . FOOT FRACTURE SURGERY    . NO PAST SURGERIES       OB History    Gravida  1   Para  1   Term  1   Preterm      AB      Living  1     SAB      TAB      Ectopic      Multiple      Live Births  1            Home Medications    Prior to Admission medications   Medication Sig Start Date End Date Taking? Authorizing Provider  acetaminophen (TYLENOL) 500 MG tablet Take 500 mg by mouth every 6 (six) hours as needed for pain (back pain).    [provider]  ibuprofen (ADVIL,MOTRIN) 600 MG tablet Take 1 tablet (600 mg total) by mouth every 6 (six) hours. 06/08/12   Levi AlandAnderson, Mark E, MD  metroNIDAZOLE (FLAGYL) 500 MG tablet Take 1 tablet (500 mg total) by mouth 2 (two) times daily. 12/03/18   Cristina GongHammond, Corina Stacy W, PA-C  naproxen (NAPROSYN) 500 MG tablet Take 1 tablet (500 mg total) by mouth 2 (two) times daily with  a meal. 08/26/15   Audry PiliMohr, Tyler, PA-C  oxyCODONE-acetaminophen (PERCOCET/ROXICET) 5-325 MG per tablet Take 1-2 tablets by mouth every 4 (four) hours as needed. 06/08/12   Levi AlandAnderson, Mark E, MD  Prenatal Vit-Fe Fumarate-FA (PRENATAL MULTIVITAMIN) TABS Take 1 tablet by mouth daily at 12 noon.    [provider]    Family History Family History  Problem Relation Age of Onset  . Hypertension Mother   . Diabetes Paternal Aunt   . Diabetes Paternal Grandfather     Social History Social History   Tobacco Use  . Smoking status: Former Smoker    Packs/day: 0.40    Types: Cigars  . Smokeless tobacco: Never Used  Substance Use Topics  . Alcohol use: No  . Drug use: No     Allergies   Patient has no known allergies.   Review of Systems Review of Systems  Constitutional: Negative for chills and fever.  Respiratory: Negative for chest tightness and shortness of breath.   Cardiovascular: Negative for chest pain.  Gastrointestinal: Negative for abdominal pain, diarrhea, nausea and vomiting.  Genitourinary: Positive for menstrual problem, pelvic pain and vaginal bleeding. Negative for dysuria, flank pain, frequency, hematuria, vaginal discharge and vaginal pain.  Musculoskeletal: Negative for back pain and neck pain.  Neurological: Negative for weakness.  All other systems reviewed and are negative.    Physical Exam Updated Vital Signs BP 120/69 (BP Location: Right Arm)   Pulse 72   Temp 98.8 F (37.1 C) (Oral)   Resp 16   Ht 5\' 5"  (1.651 m)   Wt 76.2 kg   LMP 11/18/2018 (Exact Date)   SpO2 100%   BMI 27.96 kg/m   Physical Exam Vitals signs and nursing note reviewed. Exam conducted with a chaperone present Wilburn Cornelia).  Constitutional:      General: She is not in acute distress.    Appearance: She is well-developed. She is not diaphoretic.  HENT:     Head: Normocephalic and atraumatic.  Eyes:     General: No scleral icterus.       Right eye: No discharge.         Left eye: No discharge.     Conjunctiva/sclera: Conjunctivae normal.  Neck:     Musculoskeletal: Normal range of motion.  Cardiovascular:     Rate and Rhythm: Normal rate and regular rhythm.  Pulmonary:     Effort: Pulmonary effort is normal. No respiratory distress.     Breath sounds: No stridor.  Abdominal:     General: There is no distension.  Genitourinary:    Comments: Normal external female genitalia. There is a moderate amount of blood in the vaginal canal without clots or abnormal tissue noted.  No visualized lacerations or wounds externally or internally. There is blood coming from the cervix however it is not brisk.   Is a 2 to 3 mm white spot on the cervix that does not come off with swab.   Unable to visualize IUD strings however suspect they are in a large amount of blood. No abnormal tenderness or fullness to bilateral adnexa.  No cervical motion tenderness. Musculoskeletal:        General: No deformity.  Skin:    General: Skin is warm and dry.  Neurological:     Mental Status: She is alert.     Motor: No abnormal muscle tone.  Psychiatric:        Behavior: Behavior normal.      ED Treatments / Results  Labs (all labs ordered are listed, but only abnormal results are displayed) Labs Reviewed  WET PREP, GENITAL - Abnormal; Notable for the following components:      Result Value   Clue Cells Wet Prep HPF POC PRESENT (*)    WBC, Wet Prep HPF POC MANY (*)    All other components within normal limits  HCG, QUANTITATIVE, PREGNANCY  CBC WITH DIFFERENTIAL/PLATELET  COMPREHENSIVE METABOLIC PANEL  GC/CHLAMYDIA PROBE AMP ([place]) NOT AT [facility]    EKG None  Radiology No results found.  Procedures Procedures (including critical care time)  Medications Ordered in ED Medications - No data to display   Initial Impression / Assessment and Plan / ED Course  I have reviewed the triage vital signs and the nursing notes.  Pertinent labs & imaging results that  were available during my care of the patient were reviewed by me and considered in my medical decision making (see chart for details).       Presents today for evaluation of pelvic pain and vaginal bleeding. She has had pelvic pain since her  last menstrual cycle that started on 11/18/2018.  She today has had bleeding.  She states that she is having a large amount. Pelvic exam performed showing a moderate amount of blood in the vaginal canal.  There is no cervical motion tenderness or abnormal adnexal tenderness.  Labs were obtained and reviewed, CBC and CMP without significant hematologic or electrolyte derangements.  Blood pregnancy test is negative.  Wet prep shows white blood cells and clue cells concerning for BV.  Given that her blood pregnancy test is under 1 I do not suspect ectopic pregnancy.  With the longer duration of her pain do not suspect acute torsion.  Patient is not anemic.  She is hemodynamically stable.  We will treat her BV with Flagyl.  She is advised not to take alcohol while taking this medicine.  She is instructed to follow-up with OB/GYN if her bleeding does not improve in the next 1 to 2 days.  Return precautions were discussed with patient who states their understanding.  At the time of discharge patient denied any unaddressed complaints or concerns.  Patient is agreeable for discharge home.   Final Clinical Impressions(s) / ED Diagnoses   Final diagnoses:  Abnormal vaginal bleeding  Bacterial vaginosis    ED Discharge Orders         Ordered    metroNIDAZOLE (FLAGYL) 500 MG tablet  2 times daily     12/03/18 2337           Cristina Gong, Cordelia Poche 12/03/18 2338    Tegeler, Canary Brim, MD 12/04/18 [phone]

## 2018-12-03 NOTE — ED Triage Notes (Addendum)
Pt states she started having vaginal bleeding this morning   Pt states she had a period on the 4th of this month that lasted 3 days  Pt has been having abd pain since then  Pt states her breast have been swollen and sore  Pt went to a walk in clinic and was told her to come to the emergency room to rule out an ectopic pregnancy   Pt states they did a pregnancy test at the clinic and it was negative

## 2018-12-05 LAB — GC/CHLAMYDIA PROBE AMP (~~LOC~~) NOT AT ARMC
Chlamydia: NEGATIVE
Neisseria Gonorrhea: NEGATIVE

## 2020-04-25 ENCOUNTER — Emergency Department
Admission: EM | Admit: 2020-04-25 | Discharge: 2020-04-25 | Disposition: A | Discharge status: Home / Self Care | Payer: Self-pay | Attending: Emergency Medicine | Admitting: Emergency Medicine

## 2020-04-25 DIAGNOSIS — Z331 Pregnant state, incidental: Secondary | ICD-10-CM

## 2020-04-25 DIAGNOSIS — O99330 Smoking (tobacco) complicating pregnancy, unspecified trimester: Secondary | ICD-10-CM | POA: Insufficient documentation

## 2020-04-25 DIAGNOSIS — O219 Vomiting of pregnancy, unspecified: Secondary | ICD-10-CM | POA: Insufficient documentation

## 2020-04-25 LAB — COMPREHENSIVE METABOLIC PANEL
ALT: 10 U/L (ref 0–55)
AST (SGOT): 17 U/L (ref 5–34)
Albumin/Globulin Ratio: 1.4 (ref 0.9–2.2)
Albumin: 4.2 g/dL (ref 3.5–5.0)
Alkaline Phosphatase: 46 U/L (ref 37–106)
Anion Gap: 13 (ref 5.0–15.0)
BUN: 9 mg/dL (ref 7.0–19.0)
Bilirubin, Total: 0.9 mg/dL (ref 0.2–1.2)
CO2: 19 mEq/L — ABNORMAL LOW (ref 22–29)
Calcium: 9.3 mg/dL (ref 8.5–10.5)
Chloride: 103 mEq/L (ref 100–111)
Creatinine: 0.7 mg/dL (ref 0.6–1.0)
Globulin: 3 g/dL (ref 2.0–3.6)
Glucose: 90 mg/dL (ref 70–100)
Potassium: 4.5 mEq/L (ref 3.5–5.1)
Protein, Total: 7.2 g/dL (ref 6.0–8.3)
Sodium: 135 mEq/L — ABNORMAL LOW (ref 136–145)

## 2020-04-25 LAB — CBC AND DIFFERENTIAL
Absolute NRBC: 0 10*3/uL (ref 0.00–0.00)
Basophils Absolute Automated: 0.01 10*3/uL (ref 0.00–0.08)
Basophils Automated: 0.1 %
Eosinophils Absolute Automated: 0 10*3/uL (ref 0.00–0.44)
Eosinophils Automated: 0 %
Hematocrit: 37.6 % (ref 34.7–43.7)
Hgb: 12.4 g/dL (ref 11.4–14.8)
Immature Granulocytes Absolute: 0.02 10*3/uL (ref 0.00–0.07)
Immature Granulocytes: 0.3 %
Lymphocytes Absolute Automated: 1.29 10*3/uL (ref 0.42–3.22)
Lymphocytes Automated: 16.4 %
MCH: 26.8 pg (ref 25.1–33.5)
MCHC: 33 g/dL (ref 31.5–35.8)
MCV: 81.4 fL (ref 78.0–96.0)
MPV: 13 fL — ABNORMAL HIGH (ref 8.9–12.5)
Monocytes Absolute Automated: 0.57 10*3/uL (ref 0.21–0.85)
Monocytes: 7.3 %
Neutrophils Absolute: 5.97 10*3/uL (ref 1.10–6.33)
Neutrophils: 75.9 %
Nucleated RBC: 0 /100 WBC (ref 0.0–0.0)
Platelets: 225 10*3/uL (ref 142–346)
RBC: 4.62 10*6/uL (ref 3.90–5.10)
RDW: 14 % (ref 11–15)
WBC: 7.86 10*3/uL (ref 3.10–9.50)

## 2020-04-25 LAB — URINALYSIS REFLEX TO MICROSCOPIC EXAM - REFLEX TO CULTURE
Blood, UA: NEGATIVE
Glucose, UA: NEGATIVE
Ketones UA: >=160 — AB
Leukocyte Esterase, UA: NEGATIVE
Nitrite, UA: NEGATIVE
Specific Gravity UA: >=1.03 (ref 1.001–1.035)
Urine pH: 6 (ref 5.0–8.0)
Urobilinogen, UA: 0.2 mg/dL (ref 0.2–2.0)

## 2020-04-25 LAB — LIPASE: Lipase: 9 U/L (ref 8–78)

## 2020-04-25 LAB — HCG QUANTITATIVE: hCG, Quant.: 71258.8

## 2020-04-25 LAB — GFR: EGFR: >60

## 2020-04-25 MED ORDER — ONDANSETRON 4 MG PO TBDP
4.0000 mg | ORAL_TABLET | Freq: Two times a day (BID) | ORAL | 0 refills | Status: AC | PRN
Start: 2020-04-25 — End: ?

## 2020-04-25 MED ORDER — ACETAMINOPHEN 325 MG PO TABS
650.0000 mg | ORAL_TABLET | Freq: Four times a day (QID) | ORAL | 0 refills | Status: AC | PRN
Start: 2020-04-25 — End: ?

## 2020-04-25 MED ORDER — ONDANSETRON HCL 4 MG/2ML IJ SOLN
4.0000 mg | Freq: Once | INTRAMUSCULAR | Status: AC
Start: 2020-04-25 — End: 2020-04-25
  Administered 2020-04-25: 18:00:00 4 mg via INTRAVENOUS
  Filled 2020-04-25: qty 2

## 2020-04-25 MED ORDER — SODIUM CHLORIDE 0.9 % IV BOLUS
1000.0000 mL | Freq: Once | INTRAVENOUS | Status: AC
Start: 2020-04-25 — End: 2020-04-25
  Administered 2020-04-25: 18:00:00 1000 mL via INTRAVENOUS

## 2020-04-25 NOTE — ED Notes (Signed)
Police at bedside, unable to collect blood at this time, pt in no acute distress

## 2020-04-25 NOTE — ED Notes (Signed)
Provider sent prescription for Tylenol to pt's pharmacy
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
## 2020-04-26 ENCOUNTER — Ambulatory Visit: Payer: 59 | Attending: Emergency Medicine

## 2020-04-26 VITALS — BP 110/65 | HR 98 | Resp 16 | Ht 64.0 in | Wt 143.1 lb

## 2020-04-26 DIAGNOSIS — Z87828 Personal history of other (healed) physical injury and trauma: Secondary | ICD-10-CM | POA: Insufficient documentation
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
## 2020-07-12 ENCOUNTER — Other Ambulatory Visit: Payer: Self-pay

## 2020-07-12 ENCOUNTER — Emergency Department (HOSPITAL_COMMUNITY)
Admission: EM | Admit: 2020-07-12 | Discharge: 2020-07-12 | Disposition: A | Discharge status: Home / Self Care | Payer: Medicaid Other | Attending: Emergency Medicine | Admitting: Emergency Medicine

## 2020-07-12 ENCOUNTER — Encounter (HOSPITAL_COMMUNITY): Payer: Self-pay | Admitting: Emergency Medicine

## 2020-07-12 ENCOUNTER — Emergency Department (HOSPITAL_COMMUNITY): Payer: Medicaid Other

## 2020-07-12 DIAGNOSIS — Y92410 Unspecified street and highway as the place of occurrence of the external cause: Secondary | ICD-10-CM | POA: Diagnosis not present

## 2020-07-12 DIAGNOSIS — Z87891 Personal history of nicotine dependence: Secondary | ICD-10-CM | POA: Diagnosis not present

## 2020-07-12 DIAGNOSIS — M545 Low back pain, unspecified: Secondary | ICD-10-CM

## 2020-07-12 DIAGNOSIS — O26892 Other specified pregnancy related conditions, second trimester: Secondary | ICD-10-CM | POA: Diagnosis not present

## 2020-07-12 DIAGNOSIS — Z3A17 17 weeks gestation of pregnancy: Secondary | ICD-10-CM | POA: Diagnosis not present

## 2020-07-12 DIAGNOSIS — R1084 Generalized abdominal pain: Secondary | ICD-10-CM

## 2020-07-12 DIAGNOSIS — O9A212 Injury, poisoning and certain other consequences of external causes complicating pregnancy, second trimester: Secondary | ICD-10-CM | POA: Diagnosis not present
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
## 2020-11-15 ENCOUNTER — Other Ambulatory Visit: Payer: Self-pay

## 2020-11-15 ENCOUNTER — Inpatient Hospital Stay (HOSPITAL_COMMUNITY)
Admission: AD | Admit: 2020-11-15 | Discharge: 2020-11-15 | Disposition: A | Discharge status: Home / Self Care | Payer: Medicaid Other | Attending: Obstetrics and Gynecology | Admitting: Obstetrics and Gynecology

## 2020-11-15 ENCOUNTER — Encounter (HOSPITAL_COMMUNITY): Payer: Self-pay | Admitting: Obstetrics and Gynecology

## 2020-11-15 DIAGNOSIS — Z3A35 35 weeks gestation of pregnancy: Secondary | ICD-10-CM

## 2020-11-15 DIAGNOSIS — O479 False labor, unspecified: Secondary | ICD-10-CM

## 2020-11-15 DIAGNOSIS — Z87891 Personal history of nicotine dependence: Secondary | ICD-10-CM | POA: Insufficient documentation

## 2020-11-15 DIAGNOSIS — O30043 Twin pregnancy, dichorionic/diamniotic, third trimester: Secondary | ICD-10-CM

## 2020-11-15 DIAGNOSIS — O4703 False labor before 37 completed weeks of gestation, third trimester: Secondary | ICD-10-CM | POA: Insufficient documentation

## 2020-11-15 LAB — URINALYSIS, ROUTINE W REFLEX MICROSCOPIC
Bilirubin Urine: NEGATIVE
Glucose, UA: NEGATIVE mg/dL
Hgb urine dipstick: NEGATIVE
Ketones, ur: NEGATIVE mg/dL
Leukocytes,Ua: NEGATIVE
Nitrite: NEGATIVE
Protein, ur: NEGATIVE mg/dL
Specific Gravity, Urine: 1.006 (ref 1.005–1.030)
pH: 5 (ref 5.0–8.0)

## 2020-11-15 LAB — POCT FERN TEST: POCT Fern Test: NEGATIVE

## 2020-11-15 MED ORDER — LACTATED RINGERS IV BOLUS
1000.0000 mL | Freq: Once | INTRAVENOUS | Status: AC
  Administered 2020-11-15: 1000 mL via INTRAVENOUS

## 2020-11-15 NOTE — MAU Provider Note (Signed)
Chief Complaint:  Contractions and Rupture of Membranes    HPI: Kayla Bauer is a 30 y.o. G2P1001 at 103w0d with di/di twins who presents to maternity admissions reporting contractions and leaking fluid. Patient reports contractions started last night but went away. About 20 mins prior to arrival patient reports contractions started back every 2-3 mins and she felt a gush of clear fluid. She did not wear a pad. She is also reporting "feeling like the babies are coming out of my vagina". She denies vaginal bleeding. Endorses active fetal movement. Patient is scheduled for c-section on 12/07/20 at [facility].    Pregnancy Course:   Past Medical History:  Diagnosis Date   Anxiety    OB History  Gravida Para Term Preterm AB Living  2 1 1     1   SAB IAB Ectopic Multiple Live Births          1    # Outcome Date GA Lbr Len/2nd Weight Sex Delivery Anes PTL Lv  2 Current           1 Term 06/06/12 [redacted]w[redacted]d 11:40 / 00:24 3260 g F Vag-Spont EPI  LIV   Past Surgical History:  Procedure Laterality Date   FOOT FRACTURE SURGERY     NO PAST SURGERIES     Family History  Problem Relation Age of Onset   Hypertension Mother    Diabetes Paternal Aunt    Diabetes Paternal Grandfather    Social History   Tobacco Use   Smoking status: Former    Packs/day: 0.40    Types: Cigars, Cigarettes   Smokeless tobacco: Never  Vaping Use   Vaping Use: Never used  Substance Use Topics   Alcohol use: No   Drug use: No   No Known Allergies Medications Prior to Admission  Medication Sig Dispense Refill Last Dose   acetaminophen (TYLENOL) 500 MG tablet Take 500 mg by mouth every 6 (six) hours as needed for pain (back pain).      Prenatal Vit-Fe Fumarate-FA (PRENATAL MULTIVITAMIN) TABS Take 1 tablet by mouth daily at 12 noon.       I have reviewed patient's Past Medical Hx, Surgical Hx, Family Hx, Social Hx, medications and allergies.   ROS:  Review of Systems  Constitutional: Negative.    Respiratory: Negative.    Cardiovascular: Negative.   Gastrointestinal:  Positive for abdominal pain (contractions).  Genitourinary:  Positive for vaginal discharge. Negative for vaginal bleeding.  Musculoskeletal: Negative.   Neurological: Negative.    Physical Exam  Patient Vitals for the past 24 hrs:  BP Temp Temp src Pulse Resp SpO2 Height Weight  11/15/20 1829 108/67 97.8 F (36.6 C) Oral 86 16 100 % -- --  11/15/20 1646 108/64 98.6 F (37 C) Oral 93 17 100 % 5\' 4"  (1.626 m) 71.7 kg  11/15/20 1645 -- -- -- -- -- 98 % -- --  11/15/20 1643 108/64 -- -- (!) 102 -- -- -- --   Constitutional: well-developed, well-nourished female in no acute distress.  Cardiovascular: normal rate Respiratory: normal effort GI: abd soft, non-tender, gravid MS: extremities nontender, no edema, normal ROM Neurologic: alert and oriented x 4.  Pelvic: NEFG, physiologic discharge, no evidence of pooling of amniotic fluid, no blood, cervix clean without lesions/masses, no CMT  Dilation: 1 Effacement (%): Thick Cervical Position: Posterior Station: Ballotable Exam by:: S. Yumalay Circle,CNM  FHTA: Baseline 135 bpm, moderate variability, 15x15 accelerations present, no decelerations FHTB: Baseline 150 bpm, moderate variability, 15x15 accelerations  present, no decelerations Toco: q 4-6 mins   Labs: Results for orders placed or performed during the hospital encounter of 11/15/20 (from the past 24 hour(s))  Urinalysis, Routine w reflex microscopic Urine, Clean Catch     Status: Abnormal   Collection Time: 11/15/20  5:08 PM  Result Value Ref Range   Color, Urine STRAW (A) YELLOW   APPearance CLEAR CLEAR   Specific Gravity, Urine 1.006 1.005 - 1.030   pH 5.0 5.0 - 8.0   Glucose, UA NEGATIVE NEGATIVE mg/dL   Hgb urine dipstick NEGATIVE NEGATIVE   Bilirubin Urine NEGATIVE NEGATIVE   Ketones, ur NEGATIVE NEGATIVE mg/dL   Protein, ur NEGATIVE NEGATIVE mg/dL   Nitrite NEGATIVE NEGATIVE   Leukocytes,Ua  NEGATIVE NEGATIVE  Fern Test     Status: Normal   Collection Time: 11/15/20  5:19 PM  Result Value Ref Range   POCT Fern Test Negative = intact amniotic membranes     Imaging:  No results found.  MAU Course: Orders Placed This Encounter  Procedures   Urinalysis, Routine w reflex microscopic Urine, Clean Catch   Fern Test   Meds ordered this encounter  Medications   lactated ringers bolus 1,000 mL    MDM: UA wnl Negative pooling of amniotic fluid, fern negative IVF bolus with improvement in contractions and pain Cervix 1/thick > remains unchanged after 1.5 hours NST reactive and reassuring for gestational age x2 Contractions spaced out and patient report no more pain   Assessment: 1. Braxton Hick's contraction   2. Dichorionic diamniotic twin pregnancy in third trimester     Plan: Discharge home in stable condition  Preterm labor precautions and fetal kick counts Keep appointment as scheduled at [facility] Return to MAU as needed   Allergies as of 11/15/2020   No Known Allergies      Medication List     TAKE these medications    acetaminophen 500 MG tablet Commonly known as: TYLENOL Take 500 mg by mouth every 6 (six) hours as needed for pain (back pain).   prenatal multivitamin Tabs tablet Take 1 tablet by mouth daily at 12 noon.         Camelia Eng, MSN, CNM 11/15/2020 6:28 PM

## 2020-11-15 NOTE — MAU Note (Signed)
Patient arrived to MAU complaining of contractions and leakage of fluid that started at 1600. Patient denies vaginal bleeding. And reports + fetal movement. Patient stated that she feels like her "vagina is going to Bust open". Efm commenced.

## 2020-11-15 NOTE — Discharge Instructions (Signed)
   [facility] [address], [address], [place], [place], [address] [phone]

## 2021-01-31 ENCOUNTER — Encounter (HOSPITAL_BASED_OUTPATIENT_CLINIC_OR_DEPARTMENT_OTHER): Payer: Self-pay

## 2021-08-03 ENCOUNTER — Emergency Department (HOSPITAL_BASED_OUTPATIENT_CLINIC_OR_DEPARTMENT_OTHER): Payer: Medicaid Other

## 2021-08-03 ENCOUNTER — Other Ambulatory Visit: Payer: Self-pay

## 2021-08-03 ENCOUNTER — Emergency Department (HOSPITAL_BASED_OUTPATIENT_CLINIC_OR_DEPARTMENT_OTHER)
Admission: EM | Admit: 2021-08-03 | Discharge: 2021-08-03 | Disposition: A | Discharge status: Home / Self Care | Payer: Medicaid Other | Attending: Emergency Medicine | Admitting: Emergency Medicine

## 2021-08-03 ENCOUNTER — Encounter (HOSPITAL_BASED_OUTPATIENT_CLINIC_OR_DEPARTMENT_OTHER): Payer: Self-pay

## 2021-08-03 DIAGNOSIS — M25561 Pain in right knee: Secondary | ICD-10-CM | POA: Diagnosis not present

## 2021-08-03 DIAGNOSIS — X501XXA Overexertion from prolonged static or awkward postures, initial encounter: Secondary | ICD-10-CM | POA: Diagnosis not present

## 2021-08-03 DIAGNOSIS — Y9389 Activity, other specified: Secondary | ICD-10-CM | POA: Insufficient documentation

## 2021-08-03 MED ORDER — OXYCODONE-ACETAMINOPHEN 5-325 MG PO TABS: 1.0000 | ORAL_TABLET | Freq: Four times a day (QID) | ORAL | 0 refills | Status: AC | PRN

## 2021-08-03 NOTE — ED Notes (Signed)
Patient verbalizes understanding of discharge instructions. Opportunity for questioning and answers were provided. Armband removed by staff, pt discharged from ED. Ambulated with crutches out to lobby

## 2021-08-03 NOTE — ED Provider Notes (Cosign Needed)
[facility] EMERGENCY DEPARTMENT Provider Note   CSN: [id number] Arrival date & time: 08/03/21  1711     History  Chief Complaint  Patient presents with   Knee Pain    Kayla Bauer is a 31 y.o. female with medical history of anxiety.  The patient presents to ED for evaluation of right-sided knee pain.  Patient reports that she was moving a couch last night when she pivoted and twisted the right knee.  The patient states she felt a pop and severe pain with burning sensation at this time and afterwards had associated mild swelling.  Patient states that she believes her patella was dislocated at this time laterally however she states that she reduced it with her hand because she was in shock.  Patient states that after reduction she felt sudden onset lightheadedness, diaphoresis.  Patient denies any OTC medications prior to arrival to help pain.  Patient states that she was seen at urgent care earlier today, evaluated and advised to have plain film imaging done however the urgent care did not have any access to x-ray.  The patient presented to this ED for evaluation utilizing x-ray.  Patient states that immediately after event she was able to bear weight however since this time it has become increasingly more painful to bear weight on her right foot.   Knee Pain      Home Medications Prior to Admission medications   Medication Sig Start Date End Date Taking? Authorizing Provider  oxyCODONE-acetaminophen (PERCOCET/ROXICET) 5-325 MG tablet Take 1 tablet by mouth every 6 (six) hours as needed for severe pain. 08/03/21  Yes Al Decant, PA-C  acetaminophen (TYLENOL) 500 MG tablet Take 500 mg by mouth every 6 (six) hours as needed for pain (back pain).    [provider]  Prenatal Vit-Fe Fumarate-FA (PRENATAL MULTIVITAMIN) TABS Take 1 tablet by mouth daily at 12 noon.    [provider]      Allergies    Patient has no known allergies.    Review of Systems    Review of Systems  Musculoskeletal:  Positive for arthralgias and gait problem.  All other systems reviewed and are negative.   Physical Exam Updated Vital Signs BP 102/86   Pulse 90   Temp 98.7 F (37.1 C) (Oral)   Resp 18   Ht 5\' 4"  (1.626 m)   Wt 63.5 kg   SpO2 100%   BMI 24.03 kg/m  Physical Exam Vitals and nursing note reviewed.  Constitutional:      General: She is not in acute distress.    Appearance: She is not ill-appearing, toxic-appearing or diaphoretic.  HENT:     Head: Normocephalic and atraumatic.     Nose: Nose normal. No congestion.     Mouth/Throat:     Mouth: Mucous membranes are moist.     Pharynx: Oropharynx is clear.  Eyes:     Extraocular Movements: Extraocular movements intact.     Conjunctiva/sclera: Conjunctivae normal.     Pupils: Pupils are equal, round, and reactive to light.  Cardiovascular:     Rate and Rhythm: Normal rate and regular rhythm.  Pulmonary:     Effort: Pulmonary effort is normal.     Breath sounds: Normal breath sounds.  Abdominal:     General: Abdomen is flat. Bowel sounds are normal.     Palpations: Abdomen is soft.     Tenderness: There is no abdominal tenderness.  Musculoskeletal:     Cervical back: Normal  range of motion and neck supple.     Right knee: Swelling present. No deformity, effusion or erythema. Normal range of motion. Tenderness present.     Left knee: Normal.     Comments: Patient with TTP over R patellar tendon with mild swelling. Patient has full flexion/extension of R knee. Patient denies pain to R hip and R ankle. No varus or valgus laxity but patient does have pain with valgus stress test  Skin:    General: Skin is warm and dry.     Capillary Refill: Capillary refill takes less than 2 seconds.  Neurological:     Mental Status: She is alert and oriented to person, place, and time.     ED Results / Procedures / Treatments   Labs (all labs ordered are listed, but only abnormal results are  displayed) Labs Reviewed - No data to display  EKG None  Radiology DG Knee Complete 4 Views Right  Result Date: 08/03/2021 CLINICAL DATA:  Right knee pain EXAM: RIGHT KNEE - COMPLETE 4+ VIEW COMPARISON:  None Available. FINDINGS: No evidence of fracture, dislocation, or joint effusion. No evidence of arthropathy or other focal bone abnormality. Soft tissues are unremarkable. IMPRESSION: Negative. Electronically Signed   By: Jannifer Hick M.D.   On: 08/03/2021 17:40    Procedures Procedures   Medications Ordered in ED Medications - No data to display  ED Course/ Medical Decision Making/ A&P                           Medical Decision Making Amount and/or Complexity of Data Reviewed Radiology: ordered.   31 year old female presents to ED for evaluation.  Please see HPI for further details.  On examination, the patient has tenderness palpation of her right patellar tendon with mild swelling.  There is no overlying skin change.  Patient has ability to fully flex and extend her right knee against resistance.  The patient denies pain to right hip or right ankle.  There is no varus or valgus laxity present but the patient does have pain with valgus stress testing.  Patient's plain film imaging of right knee does not show any sign of deformity, effusion, dislocation.  The patient might have reduced her patellar dislocation on her own.  Out of an abundance of caution, the patient be placed in a knee immobilizer and advised to follow-up with sports medicine.  Patient will be sent home with pain medication, orthopedic follow-up.  Patient denies currently breast-feeding.  The patient has been given return precautions and she voiced understanding.  The patient has had all of her questions answered to her satisfaction.  The patient is stable at this time for discharge home  Final Clinical Impression(s) / ED Diagnoses Final diagnoses:  Acute pain of right knee    Rx / DC Orders ED  Discharge Orders          Ordered    oxyCODONE-acetaminophen (PERCOCET/ROXICET) 5-325 MG tablet  Every 6 hours PRN        08/03/21 1941              Al Decant, PA-C 08/03/21 1942

## 2021-08-03 NOTE — Discharge Instructions (Addendum)
Please return to ED with any new or worsening signs or symptoms Please follow-up with the sports medicine doctor I referred you to.  You will need to make an appointment to be seen. Please remain in the knee immobilizer while you are walking.  Please utilize crutches. I have written you out of work for the next 5 days.  The note is attached to this document. I have sent in pain medication for you.  Please do not drive or operate heavy machinery under the influence this medication. You may utilize ice packs, heating pads, ibuprofen and Tylenol at home for additional pain relief

## 2021-08-03 NOTE — ED Triage Notes (Signed)
R knee pain after moving her couch last night. Pt seen at [facility] for same but was unable to get xrays.

## 2021-08-05 ENCOUNTER — Ambulatory Visit (HOSPITAL_BASED_OUTPATIENT_CLINIC_OR_DEPARTMENT_OTHER)
Admission: RE | Admit: 2021-08-05 | Discharge: 2021-08-05 | Disposition: A | Discharge status: Home / Self Care | Payer: Medicaid Other | Source: Ambulatory Visit | Attending: Family Medicine | Admitting: Family Medicine

## 2021-08-05 ENCOUNTER — Ambulatory Visit (INDEPENDENT_AMBULATORY_CARE_PROVIDER_SITE_OTHER): Payer: Medicaid Other | Admitting: Family Medicine

## 2021-08-05 ENCOUNTER — Ambulatory Visit: Payer: Self-pay

## 2021-08-05 ENCOUNTER — Encounter: Payer: Self-pay | Admitting: Family Medicine

## 2021-08-05 VITALS — BP 120/88 | Ht 64.0 in | Wt 140.0 lb

## 2021-08-05 DIAGNOSIS — S83004A Unspecified dislocation of right patella, initial encounter: Secondary | ICD-10-CM

## 2021-08-05 MED ORDER — MELOXICAM 15 MG PO TABS: 15.0000 mg | ORAL_TABLET | Freq: Every day | ORAL | 1 refills | Status: AC | PRN

## 2021-08-05 NOTE — Progress Notes (Signed)
  Kayla Bauer - 31 y.o. female MRN [id number]  Date of birth: 14-May-1990  SUBJECTIVE:  Including CC & ROS.  No chief complaint on file.   Kayla Bauer is a 31 y.o. female that is doing with right knee pain.  She had a patellar dislocation that she self reduced.  She has no history of previous injury or symptoms.  Having improvement but does endorse pain with weightbearing.  Review of the emergency department note from 6/20 shows she was counseled on supportive care and placed in a knee immobilizer. Independent review of the right knee x-ray from 6/20 shows no acute changes.  Review of Systems See HPI   HISTORY: Past Medical, Surgical, Social, and Family History Reviewed & Updated per EMR.   Pertinent Historical Findings include:  Past Medical History:  Diagnosis Date   Anxiety     Past Surgical History:  Procedure Laterality Date   FOOT FRACTURE SURGERY     NO PAST SURGERIES       PHYSICAL EXAM:  VS: BP 120/88 (BP Location: Left Arm, Patient Position: Sitting)   Ht 5\' 4"  (1.626 m)   Wt 140 lb (63.5 kg)   BMI 24.03 kg/m  Physical Exam Gen: NAD, alert, cooperative with exam, well-appearing MSK:  Neurovascularly intact    Limited ultrasound: Right knee:  No effusion suprapatellar pouch. Normal-appearing quadricep and patellar tendon. Normal-appearing medial and lateral joint space. Medial retinaculum is intact with mild strain at the vastus medialis at the muscular fascial junction  Summary: Evidence of previous patellar dislocation  Ultrasound and interpretation by , MD    ASSESSMENT & PLAN:   Dislocation of right patella Initial injury on 6/22.  No significant effusion today.  Does have pain with weightbearing. -Counseled on home exercise therapy and supportive care. -Provided brace. -Meloxicam. -X-rays. -Could consider further imaging or physical therapy.

## 2021-08-05 NOTE — Assessment & Plan Note (Signed)
Initial injury on 6/22.  No significant effusion today.  Does have pain with weightbearing. -Counseled on home exercise therapy and supportive care. -Provided brace. -Meloxicam. -X-rays. -Could consider further imaging or physical therapy.

## 2021-08-05 NOTE — Patient Instructions (Signed)
Nice to meet you  Please try the exercises  Please try the brace  I will call with the xray results.  Please send me a message in MyChart with any questions or updates.  Please see me back in 4 weeks.   --Dr. Jordan Likes

## 2021-08-06 ENCOUNTER — Telehealth: Payer: Self-pay | Admitting: Family Medicine

## 2021-08-06 NOTE — Telephone Encounter (Signed)
Pt informed of below.  

## 2021-09-02 ENCOUNTER — Ambulatory Visit: Payer: Medicaid Other | Admitting: Family Medicine

## 2022-05-31 ENCOUNTER — Encounter: Payer: Self-pay | Admitting: *Deleted
